# Patient Record
Sex: Male | Born: 2000 | Race: Black or African American | Hispanic: No | Marital: Single
Health system: Southern US, Community
[De-identification: ages and names within clinical notes are randomized; demographics above are authoritative.]

## PROBLEM LIST (undated history)

## (undated) DIAGNOSIS — W3400XA Accidental discharge from unspecified firearms or gun, initial encounter: Secondary | ICD-10-CM

## (undated) HISTORY — DX: Accidental discharge from unspecified firearms or gun, initial encounter: W34.00XA

---
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
## 2018-01-09 ENCOUNTER — Emergency Department (HOSPITAL_COMMUNITY): Payer: Medicaid Other

## 2018-01-09 ENCOUNTER — Other Ambulatory Visit: Payer: Self-pay

## 2018-01-09 ENCOUNTER — Emergency Department (HOSPITAL_COMMUNITY)
Admission: EM | Admit: 2018-01-09 | Discharge: 2018-01-09 | Disposition: A | Discharge status: Home / Self Care | Payer: Medicaid Other | Attending: Emergency Medicine | Admitting: Emergency Medicine

## 2018-01-09 ENCOUNTER — Encounter (HOSPITAL_COMMUNITY): Payer: Self-pay | Admitting: Emergency Medicine

## 2018-01-09 DIAGNOSIS — S01512A Laceration without foreign body of oral cavity, initial encounter: Secondary | ICD-10-CM | POA: Diagnosis not present

## 2018-01-09 DIAGNOSIS — Z79899 Other long term (current) drug therapy: Secondary | ICD-10-CM | POA: Diagnosis not present

## 2018-01-09 DIAGNOSIS — Y9355 Activity, bike riding: Secondary | ICD-10-CM | POA: Diagnosis not present

## 2018-01-09 DIAGNOSIS — Y929 Unspecified place or not applicable: Secondary | ICD-10-CM | POA: Insufficient documentation

## 2018-01-09 DIAGNOSIS — Y999 Unspecified external cause status: Secondary | ICD-10-CM | POA: Diagnosis not present

## 2018-01-09 DIAGNOSIS — S0993XA Unspecified injury of face, initial encounter: Secondary | ICD-10-CM | POA: Diagnosis present

## 2018-01-09 MED ORDER — PENICILLIN V POTASSIUM 500 MG PO TABS: 500.0000 mg | ORAL_TABLET | Freq: Three times a day (TID) | ORAL | 0 refills | Status: AC

## 2018-01-09 MED ORDER — LIDOCAINE HCL 2 % IJ SOLN
10.0000 mL | Freq: Once | INTRAMUSCULAR | Status: AC
  Administered 2018-01-09: 20 mg
  Filled 2018-01-09: qty 20

## 2018-01-09 MED ORDER — PENICILLIN V POTASSIUM 250 MG PO TABS
500.0000 mg | ORAL_TABLET | Freq: Once | ORAL | Status: AC
  Administered 2018-01-09: 500 mg via ORAL
  Filled 2018-01-09: qty 2
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
## 2018-01-09 NOTE — ED Notes (Signed)
Pt called to be roomed back from lobby. No response.
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
## 2018-01-09 NOTE — Discharge Instructions (Signed)
Please read attached information. If you experience any new or worsening signs or symptoms please return to the emergency room for evaluation. Please follow-up with your primary care provider or specialist as discussed. Please use medication prescribed only as directed and discontinue taking if you have any concerning signs or symptoms.   °

## 2018-02-28 ENCOUNTER — Ambulatory Visit (HOSPITAL_COMMUNITY)
Admission: EM | Admit: 2018-02-28 | Discharge: 2018-02-28 | Disposition: A | Discharge status: Home / Self Care | Payer: Medicaid Other | Attending: Family Medicine | Admitting: Family Medicine

## 2018-02-28 ENCOUNTER — Encounter (HOSPITAL_COMMUNITY): Payer: Self-pay | Admitting: Emergency Medicine

## 2018-02-28 DIAGNOSIS — R5383 Other fatigue: Secondary | ICD-10-CM

## 2018-02-28 DIAGNOSIS — J029 Acute pharyngitis, unspecified: Secondary | ICD-10-CM

## 2018-02-28 DIAGNOSIS — J020 Streptococcal pharyngitis: Secondary | ICD-10-CM | POA: Diagnosis not present

## 2018-02-28 LAB — POCT RAPID STREP A: STREPTOCOCCUS, GROUP A SCREEN (DIRECT): POSITIVE — AB

## 2018-02-28 MED ORDER — PENICILLIN V POTASSIUM 500 MG PO TABS: 500.0000 mg | ORAL_TABLET | Freq: Two times a day (BID) | ORAL | 0 refills | Status: AC

## 2018-02-28 NOTE — ED Provider Notes (Signed)
[facility]    CSN: [id number] Arrival date & time: 02/28/18  1052     History   Chief Complaint Chief Complaint  Patient presents with  . URI    HPI Steven Leach is a 17 y.o. male.   17 year old male comes in for 6-day history of URI symptoms. States has been outside in the cold for the past few days. Has had postnasal drip, sore throat, fatigue. Denies cough, rhinorrhea, nasal congestion. Headache that has since resolved. Has had night sweats for the past few days without fever, chills. otc cough drops without relief. Never smoker. Positive sick contact.       History reviewed. No pertinent past medical history.  There are no active problems to display for this patient.   History reviewed. No pertinent surgical history.     Home Medications    Prior to Admission medications   Medication Sig Start Date End Date Taking? Authorizing Provider  penicillin v potassium (VEETID) 500 MG tablet Take 1 tablet (500 mg total) by mouth 2 (two) times daily for 10 days. 02/28/18 03/10/18  Belinda Fisher, PA-C    Family History No family history on file.  Social History Social History   Tobacco Use  . Smoking status: Never Smoker  . Smokeless tobacco: Never Used  Substance Use Topics  . Alcohol use: No    Frequency: Never  . Drug use: No     Allergies   Patient has no known allergies.   Review of Systems Review of Systems  Reason unable to perform ROS: See HPI as above.     Physical Exam Triage Vital Signs ED Triage Vitals  Enc Vitals Group     BP 02/28/18 1140 124/75     Pulse Rate 02/28/18 1140 (!) 105     Resp 02/28/18 1140 16     Temp 02/28/18 1140 98.6 F (37 C)     Temp Source 02/28/18 1140 Oral     SpO2 02/28/18 1140 100 %     Weight 02/28/18 1141 110 lb (49.9 kg)     Height --      Head Circumference --      Peak Flow --      Pain Score 02/28/18 1141 6     Pain Loc --      Pain Edu? --      Excl. in GC? --    No data found.  Updated  Vital Signs BP 124/75   Pulse (!) 105   Temp 98.6 F (37 C) (Oral)   Resp 16   Wt 110 lb (49.9 kg)   SpO2 100%   BMI 22.22 kg/m   Physical Exam  Constitutional: He is oriented to person, place, and time. He appears well-developed and well-nourished. No distress.  HENT:  Head: Normocephalic and atraumatic.  Right Ear: Tympanic membrane, external ear and ear canal normal. Tympanic membrane is not erythematous and not bulging.  Left Ear: Tympanic membrane, external ear and ear canal normal. Tympanic membrane is not erythematous and not bulging.  Nose: Rhinorrhea present. Right sinus exhibits no maxillary sinus tenderness and no frontal sinus tenderness. Left sinus exhibits no maxillary sinus tenderness and no frontal sinus tenderness.  Mouth/Throat: Uvula is midline and mucous membranes are normal. Posterior oropharyngeal erythema present. No tonsillar exudate.  Eyes: Conjunctivae are normal. Pupils are equal, round, and reactive to light.  Neck: Normal range of motion. Neck supple.  Cardiovascular: Normal rate, regular rhythm and normal heart sounds.  Exam reveals no gallop and no friction rub.  No murmur heard. Pulmonary/Chest: Effort normal and breath sounds normal. He has no decreased breath sounds. He has no wheezes. He has no rhonchi. He has no rales.  Lymphadenopathy:    He has no cervical adenopathy.  Neurological: He is alert and oriented to person, place, and time.  Skin: Skin is warm and dry.  Psychiatric: He has a normal mood and affect. His behavior is normal. Judgment normal.   UC Treatments / Results  Labs (all labs ordered are listed, but only abnormal results are displayed) Labs Reviewed  POCT RAPID STREP A - Abnormal; Notable for the following components:      Result Value   Streptococcus, Group A Screen (Direct) POSITIVE (*)    All other components within normal limits    EKG  EKG Interpretation None       Radiology No results  found.  Procedures Procedures (including critical care time)  Medications Ordered in UC Medications - No data to display   Initial Impression / Assessment and Plan / UC Course  I have reviewed the triage vital signs and the nursing notes.  Pertinent labs & imaging results that were available during my care of the patient were reviewed by me and considered in my medical decision making (see chart for details).    Rapid strep positive. Start antibiotic as directed. Symptomatic treatment as needed. Return precautions given.   Final Clinical Impressions(s) / UC Diagnoses   Final diagnoses:  Strep pharyngitis    ED Discharge Orders        Ordered    penicillin v potassium (VEETID) 500 MG tablet  2 times daily     02/28/18 1243        Belinda FisherYu, Amy V, [place]PA-C 02/28/18 1256

## 2018-02-28 NOTE — ED Triage Notes (Signed)
PT reports post nasal drip, sore throat, fatigue since Thursday. No fevers.

## 2018-02-28 NOTE — Discharge Instructions (Signed)
Rapid strep positive. Start penicillin as directed. Tylenol/Motrin for fever and pain. Monitor for any worsening of symptoms, trouble breathing, trouble swallowing, swelling of the throat, follow up here or at the emergency department for reevaluation.  For sore throat try using a honey-based tea. Use 3 teaspoons of honey with juice squeezed from half lemon. Place shaved pieces of ginger into 1/2-1 cup of water and warm over stove top. Then mix the ingredients and repeat every 4 hours as needed.

## 2018-08-02 ENCOUNTER — Other Ambulatory Visit: Payer: Self-pay

## 2018-08-02 ENCOUNTER — Emergency Department (HOSPITAL_COMMUNITY)
Admission: EM | Admit: 2018-08-02 | Discharge: 2018-08-02 | Disposition: A | Discharge status: Home / Self Care | Payer: Medicaid Other | Attending: Emergency Medicine | Admitting: Emergency Medicine

## 2018-08-02 ENCOUNTER — Emergency Department (HOSPITAL_COMMUNITY): Payer: Medicaid Other

## 2018-08-02 ENCOUNTER — Encounter (HOSPITAL_COMMUNITY): Payer: Self-pay | Admitting: Emergency Medicine

## 2018-08-02 DIAGNOSIS — Y929 Unspecified place or not applicable: Secondary | ICD-10-CM | POA: Diagnosis not present

## 2018-08-02 DIAGNOSIS — S6991XA Unspecified injury of right wrist, hand and finger(s), initial encounter: Secondary | ICD-10-CM

## 2018-08-02 DIAGNOSIS — W228XXA Striking against or struck by other objects, initial encounter: Secondary | ICD-10-CM | POA: Insufficient documentation

## 2018-08-02 DIAGNOSIS — Y999 Unspecified external cause status: Secondary | ICD-10-CM | POA: Insufficient documentation

## 2018-08-02 DIAGNOSIS — Y939 Activity, unspecified: Secondary | ICD-10-CM | POA: Insufficient documentation

## 2018-08-02 DIAGNOSIS — M79641 Pain in right hand: Secondary | ICD-10-CM | POA: Insufficient documentation
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
## 2018-08-02 NOTE — Discharge Instructions (Addendum)
Please read and follow all provided instructions.  You have been seen today for pain to your right hand.   Tests performed today include: An x-ray of the affected area - does NOT show any broken bones or dislocations.  Vital signs. See below for your results today.   Avoid excess use of the right fifth finger.  Medications:  Please take ibuprofen per over-the-counter dosing instructions for any discomfort. Follow-up instructions: Please follow-up with your primary care provider or the provided orthopedic physician (bone specialist) if you continue to have significant pain in 1 week. In this case you may have a more severe injury that requires further care.   Return instructions:  Please return if your digits or extremity are numb or tingling, appear gray or blue, or you have severe pain (also elevate the extremity and loosen splint or wrap if you were given one) Please return if you have redness or fevers.  Please return to the Emergency Department if you experience worsening symptoms.  Please return if you have any other emergent concerns. Additional Information:  Your vital signs today were: BP 113/66 (BP Location: Right Arm)    Pulse 82    Temp 98.8 F (37.1 C) (Oral)    Resp 16    Ht 5\' 5"  (1.651 m)    Wt 59 kg    SpO2 97%    BMI 21.63 kg/m  If your blood pressure (BP) was elevated above 135/85 this visit, please have this repeated by your doctor within one month. ---------------
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
## 2020-08-12 ENCOUNTER — Emergency Department (HOSPITAL_COMMUNITY)
Admission: EM | Admit: 2020-08-12 | Discharge: 2020-08-12 | Disposition: A | Discharge status: Home / Self Care | Payer: Medicaid Other | Attending: Emergency Medicine | Admitting: Emergency Medicine

## 2020-08-12 ENCOUNTER — Emergency Department (HOSPITAL_COMMUNITY): Payer: Medicaid Other

## 2020-08-12 ENCOUNTER — Other Ambulatory Visit: Payer: Self-pay

## 2020-08-12 DIAGNOSIS — Y999 Unspecified external cause status: Secondary | ICD-10-CM | POA: Insufficient documentation

## 2020-08-12 DIAGNOSIS — X58XXXA Exposure to other specified factors, initial encounter: Secondary | ICD-10-CM | POA: Diagnosis not present

## 2020-08-12 DIAGNOSIS — S21231A Puncture wound without foreign body of right back wall of thorax without penetration into thoracic cavity, initial encounter: Secondary | ICD-10-CM | POA: Diagnosis not present

## 2020-08-12 DIAGNOSIS — S21211A Laceration without foreign body of right back wall of thorax without penetration into thoracic cavity, initial encounter: Secondary | ICD-10-CM

## 2020-08-12 DIAGNOSIS — Y939 Activity, unspecified: Secondary | ICD-10-CM | POA: Diagnosis not present

## 2020-08-12 DIAGNOSIS — Y929 Unspecified place or not applicable: Secondary | ICD-10-CM | POA: Diagnosis not present

## 2020-08-12 DIAGNOSIS — R000 Tachycardia, unspecified: Secondary | ICD-10-CM | POA: Insufficient documentation

## 2020-08-12 LAB — ETHANOL: Alcohol, Ethyl (B): <10 mg/dL (ref <10)

## 2020-08-12 LAB — LACTIC ACID, PLASMA: Lactic Acid, Venous: 8.2 mmol/L (ref 0.5–1.9)

## 2020-08-12 LAB — CBC
HCT: 46.9 % (ref 39.0–52.0)
Hemoglobin: 15.3 g/dL (ref 13.0–17.0)
MCH: 30.8 pg (ref 26.0–34.0)
MCHC: 32.6 g/dL (ref 30.0–36.0)
MCV: 94.6 fL (ref 80.0–100.0)
Platelets: 306 10*3/uL (ref 150–400)
RBC: 4.96 MIL/uL (ref 4.22–5.81)
RDW: 11.7 % (ref 11.5–15.5)
WBC: 5.8 10*3/uL (ref 4.0–10.5)
nRBC: 0 % (ref 0.0–0.2)

## 2020-08-12 LAB — COMPREHENSIVE METABOLIC PANEL
ALT: 17 U/L (ref 0–44)
AST: 26 U/L (ref 15–41)
Albumin: 4.1 g/dL (ref 3.5–5.0)
Alkaline Phosphatase: 42 U/L (ref 38–126)
Anion gap: 15 (ref 5–15)
BUN: <5 mg/dL — ABNORMAL LOW (ref 6–20)
CO2: 18 mmol/L — ABNORMAL LOW (ref 22–32)
Calcium: 9.4 mg/dL (ref 8.9–10.3)
Chloride: 105 mmol/L (ref 98–111)
Creatinine, Ser: 1.1 mg/dL (ref 0.61–1.24)
GFR calc Af Amer: >60 mL/min (ref >60)
GFR calc non Af Amer: >60 mL/min (ref >60)
Glucose, Bld: 125 mg/dL — ABNORMAL HIGH (ref 70–99)
Potassium: 3.3 mmol/L — ABNORMAL LOW (ref 3.5–5.1)
Sodium: 138 mmol/L (ref 135–145)
Total Bilirubin: 0.8 mg/dL (ref 0.3–1.2)
Total Protein: 7.5 g/dL (ref 6.5–8.1)

## 2020-08-12 LAB — SAMPLE TO BLOOD BANK

## 2020-08-12 LAB — I-STAT CHEM 8, ED
BUN: <3 mg/dL — ABNORMAL LOW (ref 6–20)
Calcium, Ion: 1.17 mmol/L (ref 1.15–1.40)
Chloride: 103 mmol/L (ref 98–111)
Creatinine, Ser: 1 mg/dL (ref 0.61–1.24)
Glucose, Bld: 123 mg/dL — ABNORMAL HIGH (ref 70–99)
HCT: 47 % (ref 39.0–52.0)
Hemoglobin: 16 g/dL (ref 13.0–17.0)
Potassium: 3.2 mmol/L — ABNORMAL LOW (ref 3.5–5.1)
Sodium: 142 mmol/L (ref 135–145)
TCO2: 20 mmol/L — ABNORMAL LOW (ref 22–32)

## 2020-08-12 LAB — PROTIME-INR
INR: 1.1 (ref 0.8–1.2)
Prothrombin Time: 14 seconds (ref 11.4–15.2)

## 2020-08-12 MED ORDER — IOHEXOL 300 MG/ML  SOLN
100.0000 mL | Freq: Once | INTRAMUSCULAR | Status: AC | PRN
  Administered 2020-08-12: 100 mL via INTRAVENOUS

## 2020-08-12 MED ORDER — OXYCODONE-ACETAMINOPHEN 5-325 MG PO TABS
1.0000 | ORAL_TABLET | Freq: Once | ORAL | Status: AC
  Administered 2020-08-12: 1 via ORAL
  Filled 2020-08-12: qty 1

## 2020-08-12 MED ORDER — SODIUM CHLORIDE 0.9 % IV SOLN
INTRAVENOUS | Status: AC | PRN
  Administered 2020-08-12: 999 mL via INTRAVENOUS

## 2020-08-12 MED ORDER — FENTANYL CITRATE (PF) 100 MCG/2ML IJ SOLN
INTRAMUSCULAR | Status: AC
  Administered 2020-08-12: 50 ug
  Filled 2020-08-12: qty 2

## 2020-08-12 NOTE — Consult Note (Signed)
Responded to page, pt unavailable, no family present, please call again if further need of chaplain services.  Rev. Eloyse Causey Chaplain 
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
## 2020-08-12 NOTE — ED Notes (Signed)
Discharge instructions discussed with pt. Pt verbalized understanding with no questions at this time. Pt to go home with family.  °

## 2020-08-12 NOTE — ED Notes (Signed)
Pt transported to CT with RN and NT
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
## 2020-10-23 ENCOUNTER — Emergency Department (HOSPITAL_COMMUNITY)
Admission: EM | Admit: 2020-10-23 | Discharge: 2020-10-23 | Disposition: A | Discharge status: Home / Self Care | Payer: Medicaid Other | Attending: Emergency Medicine | Admitting: Emergency Medicine

## 2020-10-23 ENCOUNTER — Other Ambulatory Visit: Payer: Self-pay

## 2020-10-23 ENCOUNTER — Emergency Department (HOSPITAL_COMMUNITY): Payer: Medicaid Other

## 2020-10-23 ENCOUNTER — Encounter (HOSPITAL_COMMUNITY): Payer: Self-pay | Admitting: *Deleted

## 2020-10-23 DIAGNOSIS — Y939 Activity, unspecified: Secondary | ICD-10-CM | POA: Insufficient documentation

## 2020-10-23 DIAGNOSIS — S0181XA Laceration without foreign body of other part of head, initial encounter: Secondary | ICD-10-CM | POA: Diagnosis not present

## 2020-10-23 DIAGNOSIS — Y999 Unspecified external cause status: Secondary | ICD-10-CM | POA: Insufficient documentation

## 2020-10-23 DIAGNOSIS — S0193XA Puncture wound without foreign body of unspecified part of head, initial encounter: Secondary | ICD-10-CM

## 2020-10-23 DIAGNOSIS — Z23 Encounter for immunization: Secondary | ICD-10-CM | POA: Diagnosis not present

## 2020-10-23 DIAGNOSIS — Y92830 Public park as the place of occurrence of the external cause: Secondary | ICD-10-CM | POA: Diagnosis not present

## 2020-10-23 DIAGNOSIS — S0990XA Unspecified injury of head, initial encounter: Secondary | ICD-10-CM | POA: Diagnosis present

## 2020-10-23 DIAGNOSIS — F17290 Nicotine dependence, other tobacco product, uncomplicated: Secondary | ICD-10-CM | POA: Diagnosis not present

## 2020-10-23 MED ORDER — TETANUS-DIPHTH-ACELL PERTUSSIS 5-2.5-18.5 LF-MCG/0.5 IM SUSY
0.5000 mL | PREFILLED_SYRINGE | Freq: Once | INTRAMUSCULAR | Status: AC
  Administered 2020-10-23: 0.5 mL via INTRAMUSCULAR
  Filled 2020-10-23: qty 0.5

## 2020-10-23 MED ORDER — CEPHALEXIN 500 MG PO CAPS: 500.0000 mg | ORAL_CAPSULE | Freq: Four times a day (QID) | ORAL | 0 refills | Status: DC
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
## 2020-10-23 NOTE — Discharge Instructions (Signed)
Keep the wound clean.  Take the antibiotics to help prevent infection.
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
## 2020-10-23 NOTE — ED Notes (Signed)
Pt taken to CT with TRN  

## 2020-10-24 NOTE — Consult Note (Signed)
Responded to page, initially no family present. Later called to sit with sister and friend in consult room, the latter of whom wished to pray with me to God, not Christ. Later accompanied doctor to talk with pt's dad--who was allowed to go back bedside and rec'd good news pt would be D/C.  Rev. Donnel Saxon Chaplain

## 2020-10-26 ENCOUNTER — Encounter (HOSPITAL_COMMUNITY): Payer: Self-pay | Admitting: Emergency Medicine

## 2021-08-18 ENCOUNTER — Emergency Department (HOSPITAL_COMMUNITY): Payer: Medicaid Other

## 2021-08-18 ENCOUNTER — Emergency Department (HOSPITAL_COMMUNITY)
Admission: EM | Admit: 2021-08-18 | Discharge: 2021-08-19 | Disposition: A | Discharge status: Home / Self Care | Payer: Medicaid Other | Attending: Emergency Medicine | Admitting: Emergency Medicine

## 2021-08-18 DIAGNOSIS — S0083XA Contusion of other part of head, initial encounter: Secondary | ICD-10-CM | POA: Diagnosis not present

## 2021-08-18 DIAGNOSIS — Y900 Blood alcohol level of less than 20 mg/100 ml: Secondary | ICD-10-CM | POA: Insufficient documentation

## 2021-08-18 DIAGNOSIS — Z23 Encounter for immunization: Secondary | ICD-10-CM | POA: Insufficient documentation

## 2021-08-18 DIAGNOSIS — R000 Tachycardia, unspecified: Secondary | ICD-10-CM | POA: Insufficient documentation

## 2021-08-18 DIAGNOSIS — Z20822 Contact with and (suspected) exposure to covid-19: Secondary | ICD-10-CM | POA: Diagnosis not present

## 2021-08-18 DIAGNOSIS — M79622 Pain in left upper arm: Secondary | ICD-10-CM | POA: Diagnosis not present

## 2021-08-18 DIAGNOSIS — W3400XA Accidental discharge from unspecified firearms or gun, initial encounter: Secondary | ICD-10-CM | POA: Diagnosis not present

## 2021-08-18 DIAGNOSIS — S41102A Unspecified open wound of left upper arm, initial encounter: Secondary | ICD-10-CM | POA: Diagnosis not present

## 2021-08-18 DIAGNOSIS — T1490XA Injury, unspecified, initial encounter: Secondary | ICD-10-CM

## 2021-08-18 LAB — I-STAT CHEM 8, ED
BUN: 13 mg/dL (ref 6–20)
Calcium, Ion: 1.1 mmol/L — ABNORMAL LOW (ref 1.15–1.40)
Chloride: 102 mmol/L (ref 98–111)
Creatinine, Ser: 1 mg/dL (ref 0.61–1.24)
Glucose, Bld: 128 mg/dL — ABNORMAL HIGH (ref 70–99)
HCT: 41 % (ref 39.0–52.0)
Hemoglobin: 13.9 g/dL (ref 13.0–17.0)
Potassium: 3 mmol/L — ABNORMAL LOW (ref 3.5–5.1)
Sodium: 141 mmol/L (ref 135–145)
TCO2: 23 mmol/L (ref 22–32)

## 2021-08-18 MED ORDER — SODIUM CHLORIDE 0.9 % IV BOLUS
1000.0000 mL | Freq: Once | INTRAVENOUS | Status: AC
  Administered 2021-08-19: 1000 mL via INTRAVENOUS

## 2021-08-18 MED ORDER — TETANUS-DIPHTH-ACELL PERTUSSIS 5-2.5-18.5 LF-MCG/0.5 IM SUSY
0.5000 mL | PREFILLED_SYRINGE | Freq: Once | INTRAMUSCULAR | Status: AC
  Administered 2021-08-19: 0.5 mL via INTRAMUSCULAR
  Filled 2021-08-18: qty 0.5

## 2021-08-18 MED ORDER — CEFAZOLIN SODIUM-DEXTROSE 2-4 GM/100ML-% IV SOLN
2.0000 g | Freq: Three times a day (TID) | INTRAVENOUS | Status: DC
  Administered 2021-08-19: 2 g via INTRAVENOUS
  Filled 2021-08-18: qty 100
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
## 2021-08-19 ENCOUNTER — Emergency Department (HOSPITAL_COMMUNITY): Payer: Medicaid Other

## 2021-08-19 LAB — CBC
HCT: 40.2 % (ref 39.0–52.0)
Hemoglobin: 13.7 g/dL (ref 13.0–17.0)
MCH: 31.3 pg (ref 26.0–34.0)
MCHC: 34.1 g/dL (ref 30.0–36.0)
MCV: 91.8 fL (ref 80.0–100.0)
Platelets: 232 10*3/uL (ref 150–400)
RBC: 4.38 MIL/uL (ref 4.22–5.81)
RDW: 11.3 % — ABNORMAL LOW (ref 11.5–15.5)
WBC: 6.5 10*3/uL (ref 4.0–10.5)
nRBC: 0 % (ref 0.0–0.2)

## 2021-08-19 LAB — COMPREHENSIVE METABOLIC PANEL
ALT: 11 U/L (ref 0–44)
AST: 18 U/L (ref 15–41)
Albumin: 3.4 g/dL — ABNORMAL LOW (ref 3.5–5.0)
Alkaline Phosphatase: 37 U/L — ABNORMAL LOW (ref 38–126)
Anion gap: 7 (ref 5–15)
BUN: 13 mg/dL (ref 6–20)
CO2: 25 mmol/L (ref 22–32)
Calcium: 8.4 mg/dL — ABNORMAL LOW (ref 8.9–10.3)
Chloride: 101 mmol/L (ref 98–111)
Creatinine, Ser: 1.06 mg/dL (ref 0.61–1.24)
GFR, Estimated: >60 mL/min (ref >60)
Glucose, Bld: 150 mg/dL — ABNORMAL HIGH (ref 70–99)
Potassium: 3.1 mmol/L — ABNORMAL LOW (ref 3.5–5.1)
Sodium: 133 mmol/L — ABNORMAL LOW (ref 135–145)
Total Bilirubin: 0.6 mg/dL (ref 0.3–1.2)
Total Protein: 6 g/dL — ABNORMAL LOW (ref 6.5–8.1)

## 2021-08-19 LAB — ETHANOL: Alcohol, Ethyl (B): <10 mg/dL (ref <10)

## 2021-08-19 LAB — URINALYSIS, ROUTINE W REFLEX MICROSCOPIC
Bilirubin Urine: NEGATIVE
Glucose, UA: NEGATIVE mg/dL
Hgb urine dipstick: NEGATIVE
Ketones, ur: NEGATIVE mg/dL
Leukocytes,Ua: NEGATIVE
Nitrite: NEGATIVE
Protein, ur: NEGATIVE mg/dL
Specific Gravity, Urine: >1.046 — ABNORMAL HIGH (ref 1.005–1.030)
pH: 7 (ref 5.0–8.0)

## 2021-08-19 LAB — PROTIME-INR
INR: 1.3 — ABNORMAL HIGH (ref 0.8–1.2)
Prothrombin Time: 16 seconds — ABNORMAL HIGH (ref 11.4–15.2)

## 2021-08-19 LAB — RESP PANEL BY RT-PCR (FLU A&B, COVID) ARPGX2
Influenza A by PCR: NEGATIVE
Influenza B by PCR: NEGATIVE
SARS Coronavirus 2 by RT PCR: NEGATIVE

## 2021-08-19 LAB — SAMPLE TO BLOOD BANK

## 2021-08-19 LAB — LACTIC ACID, PLASMA: Lactic Acid, Venous: 2.4 mmol/L (ref 0.5–1.9)

## 2021-08-19 MED ORDER — IOHEXOL 350 MG/ML SOLN
100.0000 mL | Freq: Once | INTRAVENOUS | Status: AC | PRN
  Administered 2021-08-19: 100 mL via INTRAVENOUS

## 2021-08-19 MED ORDER — OXYCODONE-ACETAMINOPHEN 5-325 MG PO TABS
1.0000 | ORAL_TABLET | Freq: Once | ORAL | Status: AC
  Administered 2021-08-19: 1 via ORAL
  Filled 2021-08-19: qty 1

## 2021-08-19 MED ORDER — CEPHALEXIN 500 MG PO CAPS: 500.0000 mg | ORAL_CAPSULE | Freq: Three times a day (TID) | ORAL | 0 refills | Status: AC

## 2021-08-19 MED ORDER — ASPIRIN 81 MG PO CHEW: 81.0000 mg | CHEWABLE_TABLET | Freq: Every day | ORAL | 0 refills | Status: AC

## 2021-08-19 NOTE — Progress Notes (Signed)
Orthopedic Tech Progress Note Patient Details:  Steven Leach [birth year] [id number] Patient complained of pain in the arm while applying shoulder immobilizer. Patient stated that his arm hurts when bent across chest and causes more pain in his hand. Was able to apply but not at 90 degree angle  Ortho Devices Type of Ortho Device: Shoulder immobilizer Ortho Device/Splint Location: LUE Ortho Device/Splint Interventions: Ordered, Application, Adjustment   Post Interventions Patient Tolerated: Poor Instructions Provided: Adjustment of device, Care of device, Poper ambulation with device  Elenie Coven 08/19/2021, 2:58 AM

## 2021-08-19 NOTE — Discharge Instructions (Addendum)
Your imaging shows no fractures.  There is some narrowing of one of the blood vessels in your arm.  Take the aspirin as prescribed and follow-up with the vascular surgeon next week for an ultrasound.  Return to the ED with worsening pain, weakness, numbness, or any other concerns.
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
## 2021-08-19 NOTE — ED Notes (Signed)
Pt contact would like to be called when pt is up for DC
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
## 2021-08-19 NOTE — ED Notes (Signed)
Dr. Manus Gunning notified of lactic

## 2021-08-19 NOTE — ED Notes (Signed)
Pt wounds cleansed, bacitracin applied and wrapped per MD orders

## 2021-08-19 NOTE — ED Notes (Signed)
Patient verbalizes understanding of discharge instructions. Opportunity for questioning and answers were provided. Armband removed by staff, pt discharged from ED via wheelchair.  
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
## 2021-09-07 ENCOUNTER — Ambulatory Visit (INDEPENDENT_AMBULATORY_CARE_PROVIDER_SITE_OTHER): Payer: Medicaid Other | Admitting: Vascular Surgery

## 2021-09-07 ENCOUNTER — Other Ambulatory Visit: Payer: Self-pay

## 2021-09-07 ENCOUNTER — Encounter: Payer: Self-pay | Admitting: Vascular Surgery

## 2021-09-07 VITALS — BP 133/89 | HR 103 | Temp 98.7°F | Resp 20 | Ht 63.0 in | Wt 98.0 lb

## 2021-09-07 DIAGNOSIS — S41132A Puncture wound without foreign body of left upper arm, initial encounter: Secondary | ICD-10-CM

## 2021-09-23 ENCOUNTER — Encounter: Payer: Self-pay | Admitting: Occupational Therapy

## 2021-09-23 ENCOUNTER — Other Ambulatory Visit: Payer: Self-pay

## 2021-09-23 ENCOUNTER — Ambulatory Visit: Payer: Medicaid Other | Attending: Vascular Surgery | Admitting: Occupational Therapy

## 2021-09-23 ENCOUNTER — Telehealth: Payer: Self-pay | Admitting: Occupational Therapy

## 2021-09-23 DIAGNOSIS — M6281 Muscle weakness (generalized): Secondary | ICD-10-CM

## 2021-09-23 DIAGNOSIS — R278 Other lack of coordination: Secondary | ICD-10-CM | POA: Diagnosis present

## 2021-09-23 DIAGNOSIS — M25622 Stiffness of left elbow, not elsewhere classified: Secondary | ICD-10-CM

## 2021-09-23 DIAGNOSIS — M25642 Stiffness of left hand, not elsewhere classified: Secondary | ICD-10-CM | POA: Diagnosis present

## 2021-09-23 DIAGNOSIS — R208 Other disturbances of skin sensation: Secondary | ICD-10-CM

## 2021-09-23 DIAGNOSIS — M79642 Pain in left hand: Secondary | ICD-10-CM | POA: Diagnosis present

## 2021-09-23 DIAGNOSIS — M25632 Stiffness of left wrist, not elsewhere classified: Secondary | ICD-10-CM
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
## 2021-09-23 NOTE — Patient Instructions (Addendum)
  AROM: Elbow Flexion / Extension    Gently bend elbow as far as possible. Then straighten arm as far as possible. Repeat _10___ times per set. Do _4___ sessions per day.     AROM: Wrist Extension   .  With palm down, bend wrist up. Repeat __10__ times per set.  Do __4__ sessions per day.      AROM: Forearm Pronation / Supination   With  arm in handshake position, slowly rotate palm down until stretch is felt. Relax. Then rotate palm up until stretch is felt. Repeat _10___ times per set. Do _4___ sessions per day.   Flexor Tendon Gliding (Active Full Fist)   Straighten all fingers, then make a fist, bending all joints. Repeat 10 times. Do 4 sessions per day.  Opposition (Active)    Touch tip of thumb to nail tip of each finger in turn, making an "O" shape. Repeat 10 times. Do 4 sessions per day.
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
## 2021-09-28 ENCOUNTER — Other Ambulatory Visit: Payer: Self-pay

## 2021-09-28 ENCOUNTER — Ambulatory Visit: Payer: Medicaid Other | Attending: Vascular Surgery | Admitting: Occupational Therapy

## 2021-09-28 ENCOUNTER — Encounter: Payer: Self-pay | Admitting: Occupational Therapy

## 2021-09-28 DIAGNOSIS — M25642 Stiffness of left hand, not elsewhere classified: Secondary | ICD-10-CM

## 2021-09-28 DIAGNOSIS — R208 Other disturbances of skin sensation: Secondary | ICD-10-CM | POA: Diagnosis present

## 2021-09-28 DIAGNOSIS — M25622 Stiffness of left elbow, not elsewhere classified: Secondary | ICD-10-CM | POA: Diagnosis present

## 2021-09-28 DIAGNOSIS — M6281 Muscle weakness (generalized): Secondary | ICD-10-CM | POA: Diagnosis not present

## 2021-09-28 DIAGNOSIS — R278 Other lack of coordination: Secondary | ICD-10-CM | POA: Diagnosis present

## 2021-09-28 DIAGNOSIS — M25632 Stiffness of left wrist, not elsewhere classified: Secondary | ICD-10-CM | POA: Diagnosis present

## 2021-09-28 DIAGNOSIS — M79642 Pain in left hand: Secondary | ICD-10-CM

## 2021-09-28 NOTE — Patient Instructions (Signed)
   Flip playing cards 1 at a time.  2. Deal cards with thumb.  Hold 1/2 deck in your hand and push off cards with thumb.  3. Pick up coins and put in a container.  4. Reach for empty plastic bottles/cups and release on table    5. Flexor Tendon Gliding (Active Hook Fist)   With fingers and knuckles straight, bend middle and tip joints. Do not bend large knuckles. Repeat 10 times. Do 3 sessions per day.  6. MP Flexion (Active Isolated)   Bend ALL fingers at large knuckle, keeping other fingers straight. Do not bend tips. Repeat 10 times. Do 3 sessions per day.
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
## 2021-09-30 ENCOUNTER — Ambulatory Visit: Payer: Medicaid Other | Admitting: Occupational Therapy

## 2021-10-11 ENCOUNTER — Ambulatory Visit: Payer: Medicaid Other | Admitting: Occupational Therapy

## 2021-10-13 ENCOUNTER — Other Ambulatory Visit: Payer: Self-pay

## 2021-10-13 ENCOUNTER — Ambulatory Visit: Payer: Medicaid Other | Admitting: Occupational Therapy

## 2021-10-13 DIAGNOSIS — R278 Other lack of coordination: Secondary | ICD-10-CM

## 2021-10-13 DIAGNOSIS — M79642 Pain in left hand: Secondary | ICD-10-CM

## 2021-10-13 DIAGNOSIS — M25632 Stiffness of left wrist, not elsewhere classified: Secondary | ICD-10-CM

## 2021-10-13 DIAGNOSIS — M6281 Muscle weakness (generalized): Secondary | ICD-10-CM

## 2021-10-13 DIAGNOSIS — R208 Other disturbances of skin sensation: Secondary | ICD-10-CM

## 2021-10-13 DIAGNOSIS — M25622 Stiffness of left elbow, not elsewhere classified: Secondary | ICD-10-CM

## 2021-10-13 DIAGNOSIS — M25642 Stiffness of left hand, not elsewhere classified: Secondary | ICD-10-CM
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
## 2021-10-13 NOTE — Patient Instructions (Signed)
1. Grip Strengthening (Resistive Putty)   Squeeze putty using thumb and all fingers. Repeat _20___ times. Do __2__ sessions per day.   2. Roll putty into tube on table and pinch between each finger and thumb x 10 reps each. (can do ring and small finger together)     Copyright  [employer]. All rights reserved.   

## 2021-10-19 ENCOUNTER — Ambulatory Visit: Payer: Medicaid Other | Admitting: Occupational Therapy

## 2021-10-19 ENCOUNTER — Encounter: Payer: Self-pay | Admitting: Occupational Therapy

## 2021-10-19 ENCOUNTER — Other Ambulatory Visit: Payer: Self-pay

## 2021-10-19 DIAGNOSIS — M25622 Stiffness of left elbow, not elsewhere classified: Secondary | ICD-10-CM

## 2021-10-19 DIAGNOSIS — M25642 Stiffness of left hand, not elsewhere classified: Secondary | ICD-10-CM

## 2021-10-19 DIAGNOSIS — M79642 Pain in left hand: Secondary | ICD-10-CM

## 2021-10-19 DIAGNOSIS — R278 Other lack of coordination: Secondary | ICD-10-CM

## 2021-10-19 DIAGNOSIS — M6281 Muscle weakness (generalized): Secondary | ICD-10-CM | POA: Diagnosis not present

## 2021-10-19 DIAGNOSIS — M25632 Stiffness of left wrist, not elsewhere classified: Secondary | ICD-10-CM

## 2021-10-19 DIAGNOSIS — R208 Other disturbances of skin sensation: Secondary | ICD-10-CM
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
## 2021-10-19 NOTE — Patient Instructions (Addendum)
    Cane Overhead - Standing   With arms straight, hold cane forward at waist. Raise cane above head. Hold 3 seconds. Repeat 10 times. Do 2 times per day.   ROM: Abduction - Wand   Holding wand with left hand palm up, push wand directly out to side, leading with other hand palm down, until stretch is felt. Hold 5 seconds. Repeat 10 times per set. Do 2 sessions per day.     ROM: Extension - Wand (Standing)   Stand holding wand behind back. Raise arms as far as possible.  PALMS FORWARD, HEAD/CHEST UP. Repeat 10 times per set.  Do 2-3 sessions per day.   Extension (Active)    Sitting, bend elbow so that thumb touches shoulder,  Straighten elbow without moving shoulder. Can use other hand to hold upper arm steady. Hold 3 seconds. Repeat 10 times. Do 2 sessions per day.

## 2021-10-21 ENCOUNTER — Ambulatory Visit: Payer: Medicaid Other | Admitting: Occupational Therapy

## 2021-10-26 ENCOUNTER — Ambulatory Visit: Payer: Medicaid Other | Attending: Vascular Surgery | Admitting: Occupational Therapy

## 2021-10-26 DIAGNOSIS — M6281 Muscle weakness (generalized): Secondary | ICD-10-CM | POA: Insufficient documentation

## 2021-10-26 DIAGNOSIS — M25622 Stiffness of left elbow, not elsewhere classified: Secondary | ICD-10-CM | POA: Insufficient documentation

## 2021-10-26 DIAGNOSIS — M25632 Stiffness of left wrist, not elsewhere classified: Secondary | ICD-10-CM | POA: Insufficient documentation

## 2021-10-26 DIAGNOSIS — M79642 Pain in left hand: Secondary | ICD-10-CM | POA: Insufficient documentation

## 2021-10-26 DIAGNOSIS — R278 Other lack of coordination: Secondary | ICD-10-CM | POA: Insufficient documentation

## 2021-10-26 DIAGNOSIS — R208 Other disturbances of skin sensation: Secondary | ICD-10-CM | POA: Insufficient documentation

## 2021-10-26 DIAGNOSIS — M25642 Stiffness of left hand, not elsewhere classified: Secondary | ICD-10-CM | POA: Insufficient documentation

## 2021-10-28 ENCOUNTER — Other Ambulatory Visit: Payer: Self-pay

## 2021-10-28 ENCOUNTER — Ambulatory Visit: Payer: Medicaid Other | Admitting: Occupational Therapy

## 2021-10-28 ENCOUNTER — Encounter: Payer: Self-pay | Admitting: Occupational Therapy

## 2021-10-28 DIAGNOSIS — R208 Other disturbances of skin sensation: Secondary | ICD-10-CM

## 2021-10-28 DIAGNOSIS — R278 Other lack of coordination: Secondary | ICD-10-CM

## 2021-10-28 DIAGNOSIS — M6281 Muscle weakness (generalized): Secondary | ICD-10-CM

## 2021-10-28 DIAGNOSIS — M79642 Pain in left hand: Secondary | ICD-10-CM

## 2021-10-28 DIAGNOSIS — M25642 Stiffness of left hand, not elsewhere classified: Secondary | ICD-10-CM

## 2021-10-28 DIAGNOSIS — M25622 Stiffness of left elbow, not elsewhere classified: Secondary | ICD-10-CM | POA: Diagnosis present

## 2021-10-28 DIAGNOSIS — M25632 Stiffness of left wrist, not elsewhere classified: Secondary | ICD-10-CM
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
## 2021-10-28 NOTE — Patient Instructions (Signed)
   Strengthening: Resisted Flexion   Attach tube to door.  Hold tubing with one arm at side. Pull forward and up with elbow straight. Move shoulder through pain-free range of motion, no further than shoulder height. Repeat 10 times per set.  Do 1-2 sessions per day.    Strengthening: Resisted Extension   Attach one end to door.  Hold tubing in one hand, arm forward. Pull arm back, elbow straight. Repeat 10 times per set. Do 1-2 sessions per day.   Resisted Horizontal Abduction: Bilateral   Sit or stand, tubing in both hands, palms down and arms out in front. Keeping arms straight, pinch shoulder blades together and stretch arms out. Repeat 10 times per set.  Do 1-2 sessions per day.   Elbow Flexion: Resisted   Hold tubing wrapped around  One hand and and other end secured under foot, curl arm up as far as possible keeping elbow down by your side. Repeat 10 times per set.  Do 1-2 sessions per day.   Triceps Extension (Frontal)    Left arm forward at chest height and bent to 90, end of band in hand, other end secured on same side shoulder with right hand, extend arm slowly. Hold 2seconds. Repeat 10 times, alternating arms. Do 1-2 sessions per day.

## 2021-11-02 ENCOUNTER — Ambulatory Visit: Payer: Medicaid Other | Admitting: Occupational Therapy

## 2021-11-04 ENCOUNTER — Encounter: Payer: Self-pay | Admitting: Occupational Therapy

## 2021-11-04 ENCOUNTER — Ambulatory Visit: Payer: Medicaid Other | Admitting: Occupational Therapy

## 2021-11-04 ENCOUNTER — Other Ambulatory Visit: Payer: Self-pay

## 2021-11-04 DIAGNOSIS — M25622 Stiffness of left elbow, not elsewhere classified: Secondary | ICD-10-CM

## 2021-11-04 DIAGNOSIS — R278 Other lack of coordination: Secondary | ICD-10-CM

## 2021-11-04 DIAGNOSIS — R208 Other disturbances of skin sensation: Secondary | ICD-10-CM

## 2021-11-04 DIAGNOSIS — M79642 Pain in left hand: Secondary | ICD-10-CM

## 2021-11-04 DIAGNOSIS — M6281 Muscle weakness (generalized): Secondary | ICD-10-CM | POA: Diagnosis not present

## 2021-11-04 DIAGNOSIS — M25642 Stiffness of left hand, not elsewhere classified: Secondary | ICD-10-CM

## 2021-11-04 DIAGNOSIS — M25632 Stiffness of left wrist, not elsewhere classified: Secondary | ICD-10-CM
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
## 2021-11-04 NOTE — Patient Instructions (Addendum)
   Desensitization Techniques  Progress to the next exercises when the exercises you are doing become easy.  Spend at least 1-2/day to sensitive area  1)  Using light pressure, rub the various textures along with the hypersensitive area:  A.  Flannel  E.  Cindie Crumbly.  Wool    D.  Towel  2)  With the same textures use a firmer pressure. 3)  With a small dowel rod, eraser on a pencil or base of an ink pen tap along the sensitive area.  Scar Massage Purpose: To soften/smooth scar tissue.   To desensitize sensitive areas after surgery.   To mechanically break up inner scar tissue, adhesions, therefore allowing freer        movement of injured tendons and muscle.  Technique: Use a cream/lotion to massage with, as it insures a smooth gliding motion and avoids irritation caused by rubbing skin to skin.      Apply a firm, steady pressure with your finger-tip, pulling the skin in a circular motion, up/down, and across the scarred area.   Message 5 minutes, at least 1-2 times a day, unless you are getting tender afterwards

## 2021-11-08 ENCOUNTER — Other Ambulatory Visit: Payer: Self-pay

## 2021-11-08 ENCOUNTER — Encounter: Payer: Self-pay | Admitting: Occupational Therapy

## 2021-11-08 ENCOUNTER — Ambulatory Visit: Payer: Medicaid Other | Admitting: Occupational Therapy

## 2021-11-08 DIAGNOSIS — M25642 Stiffness of left hand, not elsewhere classified: Secondary | ICD-10-CM

## 2021-11-08 DIAGNOSIS — M25632 Stiffness of left wrist, not elsewhere classified: Secondary | ICD-10-CM

## 2021-11-08 DIAGNOSIS — M6281 Muscle weakness (generalized): Secondary | ICD-10-CM

## 2021-11-08 DIAGNOSIS — R208 Other disturbances of skin sensation: Secondary | ICD-10-CM

## 2021-11-08 DIAGNOSIS — R278 Other lack of coordination: Secondary | ICD-10-CM

## 2021-11-08 DIAGNOSIS — M79642 Pain in left hand: Secondary | ICD-10-CM

## 2021-11-08 DIAGNOSIS — M25622 Stiffness of left elbow, not elsewhere classified: Secondary | ICD-10-CM
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
## 2021-11-10 ENCOUNTER — Encounter: Payer: Medicaid Other | Admitting: Occupational Therapy

## 2021-11-15 ENCOUNTER — Ambulatory Visit: Payer: Medicaid Other | Admitting: Occupational Therapy

## 2021-11-22 ENCOUNTER — Ambulatory Visit: Payer: Medicaid Other | Admitting: Occupational Therapy

## 2021-11-24 ENCOUNTER — Ambulatory Visit: Payer: Medicaid Other | Admitting: Occupational Therapy

## 2021-12-23 ENCOUNTER — Encounter: Payer: Self-pay | Admitting: Occupational Therapy

## 2021-12-23 NOTE — Therapy (Signed)
[facility] [address] [place], [place], [address] Phone: [phone]   Fax:  [phone]  Patient Details  Name: Steven Leach MRN: [id number] Date of Birth: March 16, 2001 Referring Provider:  No ref. provider found  Encounter Date: 12/23/2021   OCCUPATIONAL THERAPY DISCHARGE SUMMARY  Visits from Start of Care: 7  Current functional level related to goals / functional outcomes:  OT Short Term Goals - 11/04/21 1042       OT SHORT TERM GOAL #1   Title Pt will be independent with initial HEP.--check STGs 10/22/21    Baseline dependent, no HEP    Time 4    Period Weeks    Status Achieved      OT SHORT TERM GOAL #2   Title Pt will demo at least 55* L wrist ext for ADLs.    Baseline 40*    Time 4    Period Weeks    Status Achieved   10/19/21:  70*     OT SHORT TERM GOAL #3   Title Pt demo at least -15* elbow ext for functional reaching.    Baseline -35*    Time 4    Period Weeks    Status Achieved   10/19/21:  -5 to 10*, but can fully extend with effort     OT SHORT TERM GOAL #4   Title Pt will demo at least 90% gross finger flex for grasping objects.    Baseline 50%    Time 4    Period Weeks    Status Achieved   10/19/21:  WNL     OT SHORT TERM GOAL #5   Title Pt will verbalize understanding of proper positioning, desensitization, and pain management strategies.    Baseline needs education    Time 4    Period Weeks    Status Achieved             OT Long Term Goals - 11/04/21 1033       OT LONG TERM GOAL #1   Title Pt will be independent with updated HEP.    Baseline needs education, no HEP    Time 12    Period Weeks    Status Partially met--was updated, but did not return for final updates     OT LONG TERM GOAL #2   Title Pt will be able to grasp/release 1-2lb object from overhead shelf with good control x3.  (10/19/21:  upgraded to 4lb object)    Baseline unable    Time 12    Period  Weeks    Status Unable to formally reassess updated goal due to not returning     OT LONG TERM GOAL #3   Title Pt will demo at least 20lbs L grip strength for picking up objects.--upgraded 10/19/21 to 40lbs L grip strength.    Baseline unable    Time 12    Period Weeks    Status Met initial, but Not met for upgraded goal   10/19/21:  21lbs.  11/04/21:  30.2lbs     OT LONG TERM GOAL #4   Title Pt will demo improved L hand coordination as shown by completing 9-hole peg test in 35sec or less.    Baseline unable    Time 12    Period Weeks    Status Achieved   10/19/21:  29.54sec     OT LONG TERM GOAL #5   Title Pt will use LUE as nondominant assist for BADLs and  light IADLs at least 90% of the time.    Baseline not using LUE functionally    Time 12    Period Weeks    Status Achieved   10/19/21              Remaining deficits: Decr strength  Sensation, pain, and ROM improved    Education / Equipment: Pt instructed in desensitization techniques, HEP, proper positioning   Patient agrees to discharge. Patient goals were partially met. Patient is being discharged due to not returning since the last visit.Marland Kitchen     [facility], [place] 12/23/2021, 10:54 AM  [facility] [address] [place], [place], [address] Phone: [phone]   Fax:  [place], OTR/L [facility] [address] [place], [place]  [address] [phone] phone [phone] 12/23/21 10:54 AM

## 2023-11-03 ENCOUNTER — Encounter (HOSPITAL_COMMUNITY): Payer: Self-pay

## 2023-11-03 ENCOUNTER — Other Ambulatory Visit: Payer: Self-pay

## 2023-11-03 ENCOUNTER — Emergency Department (HOSPITAL_COMMUNITY)
Admission: EM | Admit: 2023-11-03 | Discharge: 2023-11-03 | Disposition: A | Discharge status: Home / Self Care | Payer: Medicaid Other | Attending: Emergency Medicine | Admitting: Emergency Medicine

## 2023-11-03 DIAGNOSIS — Z7982 Long term (current) use of aspirin: Secondary | ICD-10-CM | POA: Diagnosis not present

## 2023-11-03 DIAGNOSIS — U071 COVID-19: Secondary | ICD-10-CM | POA: Insufficient documentation

## 2023-11-03 DIAGNOSIS — R509 Fever, unspecified: Secondary | ICD-10-CM | POA: Diagnosis present

## 2023-11-03 LAB — URINALYSIS, ROUTINE W REFLEX MICROSCOPIC
Bilirubin Urine: NEGATIVE
Glucose, UA: NEGATIVE mg/dL
Hgb urine dipstick: NEGATIVE
Ketones, ur: NEGATIVE mg/dL
Leukocytes,Ua: NEGATIVE
Nitrite: NEGATIVE
Protein, ur: NEGATIVE mg/dL
Specific Gravity, Urine: 1.001 — ABNORMAL LOW (ref 1.005–1.030)
pH: 8 (ref 5.0–8.0)

## 2023-11-03 LAB — RAPID URINE DRUG SCREEN, HOSP PERFORMED
Amphetamines: NOT DETECTED
Barbiturates: NOT DETECTED
Benzodiazepines: NOT DETECTED
Cocaine: NOT DETECTED
Opiates: NOT DETECTED
Tetrahydrocannabinol: POSITIVE — AB

## 2023-11-03 LAB — CK: Total CK: 196 U/L (ref 49–397)

## 2023-11-03 LAB — RESP PANEL BY RT-PCR (RSV, FLU A&B, COVID)  RVPGX2
Influenza A by PCR: NEGATIVE
Influenza B by PCR: NEGATIVE
Resp Syncytial Virus by PCR: NEGATIVE
SARS Coronavirus 2 by RT PCR: POSITIVE — AB

## 2023-11-03 LAB — BASIC METABOLIC PANEL
Anion gap: 10 (ref 5–15)
BUN: 8 mg/dL (ref 6–20)
CO2: 25 mmol/L (ref 22–32)
Calcium: 9.8 mg/dL (ref 8.9–10.3)
Chloride: 101 mmol/L (ref 98–111)
Creatinine, Ser: 0.84 mg/dL (ref 0.61–1.24)
GFR, Estimated: >60 mL/min (ref >60)
Glucose, Bld: 97 mg/dL (ref 70–99)
Potassium: 3.5 mmol/L (ref 3.5–5.1)
Sodium: 136 mmol/L (ref 135–145)

## 2023-11-03 LAB — CBC
HCT: 45.7 % (ref 39.0–52.0)
Hemoglobin: 15.8 g/dL (ref 13.0–17.0)
MCH: 31.9 pg (ref 26.0–34.0)
MCHC: 34.6 g/dL (ref 30.0–36.0)
MCV: 92.3 fL (ref 80.0–100.0)
Platelets: 191 10*3/uL (ref 150–400)
RBC: 4.95 MIL/uL (ref 4.22–5.81)
RDW: 11.7 % (ref 11.5–15.5)
WBC: 3.9 10*3/uL — ABNORMAL LOW (ref 4.0–10.5)
nRBC: 0 % (ref 0.0–0.2)

## 2023-11-03 MED ORDER — LORAZEPAM 1 MG PO TABS
1.0000 mg | ORAL_TABLET | Freq: Once | ORAL | Status: AC
Start: 2023-11-03 — End: 2023-11-03
  Administered 2023-11-03: 1 mg via ORAL
  Filled 2023-11-03: qty 1

## 2023-11-03 MED ORDER — ONDANSETRON 8 MG PO TBDP
8.0000 mg | ORAL_TABLET | Freq: Once | ORAL | Status: AC
  Administered 2023-11-03: 8 mg via ORAL
  Filled 2023-11-03: qty 1

## 2023-11-03 NOTE — Discharge Instructions (Addendum)
You may alternate ibuprofen and Tylenol at home for symptomatic control.  May purchase NyQuil to help with symptoms as well.  Purchase Zicam which is being which is over-the-counter.  See work note attached.  Please wear mask when out in public.  Please follow-up with your PCP for reevaluation.  Return to the E D for further management and care if you feel indicated.
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
## 2023-11-03 NOTE — ED Triage Notes (Signed)
Patient BIB EMS for nausea, vomiting, headache, muscle cramps, dehydration, and increased anxiety x 1 day.
